# Patient Record
Sex: Male | Born: 1965 | Hispanic: No | Marital: Married | Smoking: Never smoker
Health system: Southern US, Community
[De-identification: ages and names within clinical notes are randomized; demographics above are authoritative.]

---

## 2010-03-09 ENCOUNTER — Emergency Department (HOSPITAL_COMMUNITY): Admission: EM | Admit: 2010-03-09 | Discharge: 2010-03-10 | Payer: Self-pay | Admitting: Emergency Medicine

## 2010-11-12 ENCOUNTER — Emergency Department (HOSPITAL_COMMUNITY): Admission: EM | Admit: 2010-11-12 | Discharge: 2010-03-10 | Payer: Self-pay | Admitting: Emergency Medicine

## 2011-03-07 IMAGING — CR DG FINGER THUMB 2+V*R*
1 series · 1 of 1 positions shown · non-contrast
Comparison: None

CLINICAL DATA: Thumb injury with saw.

RIGHT THUMB 2+V

[view not recorded]
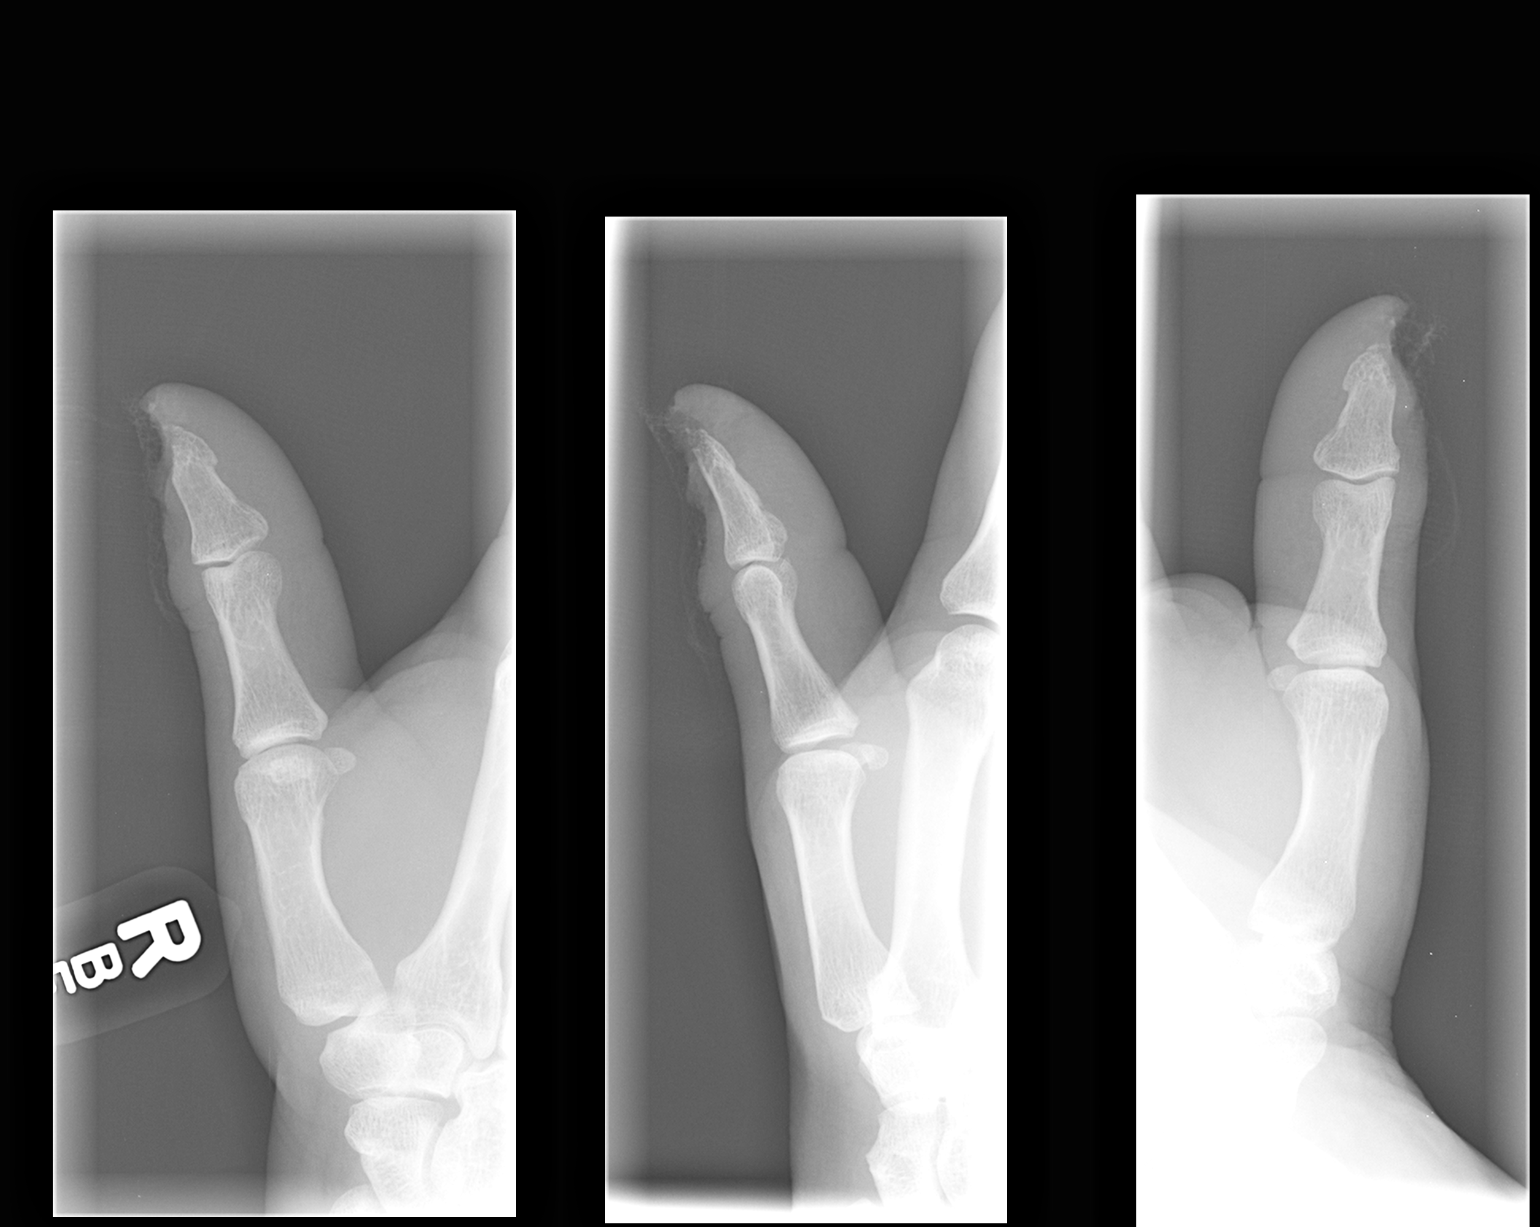

[1 of 1 positions shown; findings below may reference images not displayed]

FINDINGS: Soft tissue injury with amputation of a small portion of
the thumb tip is noted with a small tuft fracture.
The interphalangeal joint and MCP joint are unremarkable.
No definite radiopaque foreign body is noted.
IMPRESSION: Distal thumb injury involving the distal tuft.

## 2016-05-17 ENCOUNTER — Encounter (HOSPITAL_COMMUNITY): Payer: Self-pay

## 2016-05-17 ENCOUNTER — Emergency Department (HOSPITAL_COMMUNITY)
Admission: EM | Admit: 2016-05-17 | Discharge: 2016-05-17 | Disposition: A | Discharge status: Home / Self Care | Payer: BLUE CROSS/BLUE SHIELD | Attending: Emergency Medicine | Admitting: Emergency Medicine

## 2016-05-17 DIAGNOSIS — L720 Epidermal cyst: Secondary | ICD-10-CM | POA: Insufficient documentation

## 2016-05-17 DIAGNOSIS — R220 Localized swelling, mass and lump, head: Secondary | ICD-10-CM | POA: Diagnosis present

## 2016-05-17 MED ORDER — SULFAMETHOXAZOLE-TRIMETHOPRIM 800-160 MG PO TABS: 1.0000 | ORAL_TABLET | Freq: Two times a day (BID) | ORAL | Status: AC

## 2016-05-17 MED ORDER — IBUPROFEN 800 MG PO TABS: 800.0000 mg | ORAL_TABLET | Freq: Three times a day (TID) | ORAL | Status: AC

## 2016-05-17 MED ORDER — IBUPROFEN 800 MG PO TABS
800.0000 mg | ORAL_TABLET | Freq: Once | ORAL | Status: AC
  Administered 2016-05-17: 800 mg via ORAL
  Filled 2016-05-17: qty 1

## 2016-05-17 MED ORDER — SULFAMETHOXAZOLE-TRIMETHOPRIM 800-160 MG PO TABS
1.0000 | ORAL_TABLET | Freq: Once | ORAL | Status: AC
Start: 2016-05-17 — End: 2016-05-17
  Administered 2016-05-17: 1 via ORAL
  Filled 2016-05-17: qty 1

## 2016-05-17 NOTE — ED Notes (Signed)
Patient c/o bump in head that is now causing headaches

## 2016-05-17 NOTE — Discharge Instructions (Signed)
Epidermal Cyst  An epidermal cyst is usually a small, painless lump under the skin. Cysts often occur on the face, neck, stomach, chest, or genitals. The cyst may be filled with a bad smelling paste. Do not pop your cyst. Popping the cyst can cause pain and puffiness (swelling).  HOME CARE   · Only take medicines as told by your doctor.  · Take your medicine (antibiotics) as told. Finish it even if you start to feel better.  GET HELP RIGHT AWAY IF:  · Your cyst is tender, red, or puffy.  · You are not getting better, or you are getting worse.  · You have any questions or concerns.  MAKE SURE YOU:  · Understand these instructions.  · Will watch your condition.  · Will get help right away if you are not doing well or get worse.     This information is not intended to replace advice given to you by your health care provider. Make sure you discuss any questions you have with your health care provider.     Document Released: 12/30/2004 Document Revised: 05/23/2012 Document Reviewed: 05/31/2011  [employer]vier Interactive Patient Education ©2016 [employer]

## 2016-05-17 NOTE — ED Provider Notes (Signed)
CSN: [id number]     Arrival date & time 05/17/16  2157 History   First MD Initiated Contact with Patient 05/17/16 2309     Chief Complaint  Patient presents with  . Abscess     (Consider location/radiation/quality/duration/timing/severity/associated sxs/prior Treatment) HPI   Russell Campbell is a 50 y.o. male who presents to the Emergency Department complaining of chronic "bump" to the scalp that has been present for "years" and has recently became larger in size and associated with intermittent headaches and now tender to touch.  He denies fever, chills, visual changes, neck pain or stiffness, and vomiting. He has not tried any therapies or medications for symptom relief.    History reviewed. No pertinent past medical history. History reviewed. No pertinent past surgical history. History reviewed. No pertinent family history. Social History  Substance Use Topics  . Smoking status: Never Smoker   . Smokeless tobacco: None  . Alcohol Use: No    Review of Systems  Constitutional: Negative for fever and chills.  HENT: Negative for sore throat.   Gastrointestinal: Negative for nausea and vomiting.  Musculoskeletal: Negative for joint swelling, arthralgias, neck pain and neck stiffness.  Skin: Negative for color change.       "bump" to his scalp  Neurological: Positive for headaches. Negative for dizziness, syncope, speech difficulty and weakness.  Hematological: Negative for adenopathy.  All other systems reviewed and are negative.     Allergies  Review of patient's allergies indicates no known allergies.  Home Medications   Prior to Admission medications   Not on File   BP 134/89 mmHg  Pulse 75  Temp(Src) 98 F (36.7 C) (Oral)  Resp 18  Ht 5\' 4"  (1.626 m)  Wt 91.173 kg  BMI 34.48 kg/m2  SpO2 97% Physical Exam  Constitutional: He is oriented to person, place, and time. He appears well-developed and well-nourished. No distress.  HENT:  Head: Normocephalic and  atraumatic.  Mouth/Throat: Oropharynx is clear and moist.  Neck: Normal range of motion, full passive range of motion without pain and phonation normal. Neck supple. No Kernig's sign noted.  Cardiovascular: Normal rate, regular rhythm and normal heart sounds.   No murmur heard. Pulmonary/Chest: Effort normal and breath sounds normal. No respiratory distress.  Musculoskeletal: Normal range of motion.  Lymphadenopathy:    He has no cervical adenopathy.  Neurological: He is alert and oriented to person, place, and time. He exhibits normal muscle tone. Coordination normal.  Skin: Skin is warm and dry. There is erythema.  Flesh colored, mobile, 2.5 cm nodule to scalp, appears superficial   No drainage or erythema.  No fluctuance  Nursing note and vitals reviewed.   ED Course  Procedures (including critical care time) Labs Review Labs Reviewed - No data to display  Imaging Review No results found. I have personally reviewed and evaluated these images and lab results as part of my medical decision-making.   EKG Interpretation None      MDM   Final diagnoses:  Epidermal cyst    Pt well appearing.  Single, flesh colored, mobile nodule to the scalp likely epidermal cyst vs possible lipoma. No lymphadenopathy.  Headaches are intermittent and frontal. Appears stable for d/c, will try rx bactrim and referral for gen surgery.        Pauline Ausammy Tylar Amborn, PA-C 05/18/16 0036  Dione Boozeavid Glick, MD 05/18/16 [phone]

## 2022-08-16 DIAGNOSIS — Z7689 Persons encountering health services in other specified circumstances: Secondary | ICD-10-CM | POA: Diagnosis not present

## 2022-08-16 DIAGNOSIS — Z6834 Body mass index (BMI) 34.0-34.9, adult: Secondary | ICD-10-CM | POA: Diagnosis not present

## 2022-08-16 DIAGNOSIS — E669 Obesity, unspecified: Secondary | ICD-10-CM | POA: Diagnosis not present

## 2022-08-16 DIAGNOSIS — Z0189 Encounter for other specified special examinations: Secondary | ICD-10-CM | POA: Diagnosis not present

## 2022-08-20 DIAGNOSIS — E669 Obesity, unspecified: Secondary | ICD-10-CM | POA: Diagnosis not present

## 2022-08-24 DIAGNOSIS — E669 Obesity, unspecified: Secondary | ICD-10-CM | POA: Diagnosis not present

## 2022-08-24 DIAGNOSIS — E785 Hyperlipidemia, unspecified: Secondary | ICD-10-CM | POA: Diagnosis not present

## 2022-08-24 DIAGNOSIS — R7401 Elevation of levels of liver transaminase levels: Secondary | ICD-10-CM | POA: Diagnosis not present

## 2022-08-24 DIAGNOSIS — Z0001 Encounter for general adult medical examination with abnormal findings: Secondary | ICD-10-CM | POA: Diagnosis not present

## 2022-08-24 DIAGNOSIS — E1165 Type 2 diabetes mellitus with hyperglycemia: Secondary | ICD-10-CM | POA: Diagnosis not present

## 2022-08-24 DIAGNOSIS — Z1211 Encounter for screening for malignant neoplasm of colon: Secondary | ICD-10-CM | POA: Diagnosis not present

## 2022-08-24 DIAGNOSIS — Z6833 Body mass index (BMI) 33.0-33.9, adult: Secondary | ICD-10-CM | POA: Diagnosis not present

## 2022-08-24 DIAGNOSIS — K219 Gastro-esophageal reflux disease without esophagitis: Secondary | ICD-10-CM | POA: Diagnosis not present

## 2022-08-24 DIAGNOSIS — R809 Proteinuria, unspecified: Secondary | ICD-10-CM | POA: Diagnosis not present

## 2022-09-13 DIAGNOSIS — Z1211 Encounter for screening for malignant neoplasm of colon: Secondary | ICD-10-CM | POA: Diagnosis not present

## 2022-09-21 ENCOUNTER — Encounter (INDEPENDENT_AMBULATORY_CARE_PROVIDER_SITE_OTHER): Payer: Self-pay | Admitting: *Deleted

## 2022-12-13 ENCOUNTER — Encounter (INDEPENDENT_AMBULATORY_CARE_PROVIDER_SITE_OTHER): Payer: Self-pay | Admitting: Gastroenterology

## 2022-12-13 ENCOUNTER — Ambulatory Visit (INDEPENDENT_AMBULATORY_CARE_PROVIDER_SITE_OTHER): Payer: 59 | Admitting: Gastroenterology

## 2022-12-14 ENCOUNTER — Encounter (INDEPENDENT_AMBULATORY_CARE_PROVIDER_SITE_OTHER): Payer: Self-pay | Admitting: *Deleted

## 2023-01-26 DIAGNOSIS — E1165 Type 2 diabetes mellitus with hyperglycemia: Secondary | ICD-10-CM | POA: Diagnosis not present

## 2023-01-26 DIAGNOSIS — E785 Hyperlipidemia, unspecified: Secondary | ICD-10-CM | POA: Diagnosis not present

## 2023-02-01 ENCOUNTER — Encounter (INDEPENDENT_AMBULATORY_CARE_PROVIDER_SITE_OTHER): Payer: Self-pay | Admitting: *Deleted

## 2023-02-01 ENCOUNTER — Encounter (INDEPENDENT_AMBULATORY_CARE_PROVIDER_SITE_OTHER): Payer: Self-pay | Admitting: Gastroenterology

## 2023-02-01 ENCOUNTER — Ambulatory Visit (INDEPENDENT_AMBULATORY_CARE_PROVIDER_SITE_OTHER): Payer: 59 | Admitting: Gastroenterology

## 2023-02-04 DIAGNOSIS — Z713 Dietary counseling and surveillance: Secondary | ICD-10-CM | POA: Diagnosis not present

## 2023-02-04 DIAGNOSIS — R749 Abnormal serum enzyme level, unspecified: Secondary | ICD-10-CM | POA: Diagnosis not present

## 2023-02-04 DIAGNOSIS — Z6835 Body mass index (BMI) 35.0-35.9, adult: Secondary | ICD-10-CM | POA: Diagnosis not present

## 2023-02-04 DIAGNOSIS — R7401 Elevation of levels of liver transaminase levels: Secondary | ICD-10-CM | POA: Diagnosis not present

## 2023-02-04 DIAGNOSIS — Z79899 Other long term (current) drug therapy: Secondary | ICD-10-CM | POA: Diagnosis not present

## 2023-02-04 DIAGNOSIS — Z7984 Long term (current) use of oral hypoglycemic drugs: Secondary | ICD-10-CM | POA: Diagnosis not present

## 2023-02-04 DIAGNOSIS — E1165 Type 2 diabetes mellitus with hyperglycemia: Secondary | ICD-10-CM | POA: Diagnosis not present

## 2023-02-04 DIAGNOSIS — E785 Hyperlipidemia, unspecified: Secondary | ICD-10-CM | POA: Diagnosis not present

## 2023-02-04 DIAGNOSIS — R809 Proteinuria, unspecified: Secondary | ICD-10-CM | POA: Diagnosis not present

## 2023-02-04 DIAGNOSIS — Z1211 Encounter for screening for malignant neoplasm of colon: Secondary | ICD-10-CM | POA: Diagnosis not present

## 2023-02-04 DIAGNOSIS — E669 Obesity, unspecified: Secondary | ICD-10-CM | POA: Diagnosis not present

## 2023-02-04 DIAGNOSIS — K219 Gastro-esophageal reflux disease without esophagitis: Secondary | ICD-10-CM | POA: Diagnosis not present

## 2023-02-08 ENCOUNTER — Encounter (INDEPENDENT_AMBULATORY_CARE_PROVIDER_SITE_OTHER): Payer: Self-pay | Admitting: *Deleted

## 2023-05-17 DIAGNOSIS — E119 Type 2 diabetes mellitus without complications: Secondary | ICD-10-CM | POA: Diagnosis not present

## 2023-05-17 DIAGNOSIS — Z139 Encounter for screening, unspecified: Secondary | ICD-10-CM | POA: Diagnosis not present

## 2023-05-17 DIAGNOSIS — E785 Hyperlipidemia, unspecified: Secondary | ICD-10-CM | POA: Diagnosis not present

## 2023-05-20 DIAGNOSIS — R7401 Elevation of levels of liver transaminase levels: Secondary | ICD-10-CM | POA: Diagnosis not present

## 2023-05-20 DIAGNOSIS — E669 Obesity, unspecified: Secondary | ICD-10-CM | POA: Diagnosis not present

## 2023-05-20 DIAGNOSIS — E785 Hyperlipidemia, unspecified: Secondary | ICD-10-CM | POA: Diagnosis not present

## 2023-05-20 DIAGNOSIS — K219 Gastro-esophageal reflux disease without esophagitis: Secondary | ICD-10-CM | POA: Diagnosis not present

## 2023-05-20 DIAGNOSIS — Z6836 Body mass index (BMI) 36.0-36.9, adult: Secondary | ICD-10-CM | POA: Diagnosis not present

## 2023-05-20 DIAGNOSIS — R809 Proteinuria, unspecified: Secondary | ICD-10-CM | POA: Diagnosis not present

## 2023-05-20 DIAGNOSIS — Z2820 Immunization not carried out because of patient decision for unspecified reason: Secondary | ICD-10-CM | POA: Diagnosis not present

## 2023-05-20 DIAGNOSIS — E1165 Type 2 diabetes mellitus with hyperglycemia: Secondary | ICD-10-CM | POA: Diagnosis not present

## 2023-08-11 ENCOUNTER — Encounter (INDEPENDENT_AMBULATORY_CARE_PROVIDER_SITE_OTHER): Payer: Self-pay | Admitting: *Deleted

## 2024-07-02 ENCOUNTER — Encounter (INDEPENDENT_AMBULATORY_CARE_PROVIDER_SITE_OTHER): Payer: Self-pay | Admitting: *Deleted

## 2025-01-02 ENCOUNTER — Encounter (INDEPENDENT_AMBULATORY_CARE_PROVIDER_SITE_OTHER): Payer: Self-pay | Admitting: *Deleted
